# Patient Record
Sex: Male | Born: 2000
Health system: Southern US, Community
[De-identification: ages and names within clinical notes are randomized; demographics above are authoritative.]

## PROBLEM LIST (undated history)

## (undated) HISTORY — PX: ADENOIDECTOMY: SUR15

## (undated) HISTORY — PX: TONSILLECTOMY: SUR1361

---
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
## 2022-03-22 ENCOUNTER — Emergency Department (HOSPITAL_BASED_OUTPATIENT_CLINIC_OR_DEPARTMENT_OTHER): Payer: BC Managed Care – PPO

## 2022-03-22 ENCOUNTER — Emergency Department (HOSPITAL_BASED_OUTPATIENT_CLINIC_OR_DEPARTMENT_OTHER)
Admission: EM | Admit: 2022-03-22 | Discharge: 2022-03-22 | Disposition: A | Discharge status: Home / Self Care | Payer: BC Managed Care – PPO | Attending: Emergency Medicine | Admitting: Emergency Medicine

## 2022-03-22 ENCOUNTER — Other Ambulatory Visit: Payer: Self-pay

## 2022-03-22 DIAGNOSIS — S0990XA Unspecified injury of head, initial encounter: Secondary | ICD-10-CM | POA: Diagnosis not present

## 2022-03-22 DIAGNOSIS — R42 Dizziness and giddiness: Secondary | ICD-10-CM | POA: Diagnosis not present

## 2022-03-22 DIAGNOSIS — S0992XA Unspecified injury of nose, initial encounter: Secondary | ICD-10-CM

## 2022-03-22 DIAGNOSIS — W500XXA Accidental hit or strike by another person, initial encounter: Secondary | ICD-10-CM | POA: Diagnosis not present

## 2022-03-22 DIAGNOSIS — S060X0A Concussion without loss of consciousness, initial encounter: Secondary | ICD-10-CM | POA: Diagnosis not present

## 2022-03-22 NOTE — ED Provider Notes (Signed)
[facility] EMERGENCY DEPT Provider Note   CSN: [id number] Arrival date & time: 03/22/22  1849     History  Chief Complaint  Patient presents with   Facial Injury    Nose Injury   Dizziness    Jeremy Weber is a 21 y.o. male presenting to ED with a facial injury.  Patient reports that he was accidentally struck in the nose by an elbow playing a sport earlier today.  He had bleeding out of the nose which is since stopped.  He reports a headache and feels lightheaded.  He is not certain whether he has a prior history of concussions but thinks "maybe".  He is here with his father at bedside.  He is not on blood thinners and no other medical issues.  No other injuries reported.  HPI     Home Medications Prior to Admission medications   Medication Sig Start Date End Date Taking? Authorizing Provider  Amphetamine Sulfate (EVEKEO) 10 MG TABS Take 1 tablet by mouth daily.    [provider]  ibuprofen (ADVIL,MOTRIN) 200 MG tablet Take 400 mg by mouth once.    [provider]      Allergies    Sulfa antibiotics    Review of Systems   Review of Systems  Physical Exam Updated Vital Signs BP 123/72   Pulse 60   Temp 98.3 F (36.8 C) (Oral)   Resp 18   Ht 5\' 10"  (1.778 m)   Wt 74.8 kg   SpO2 100%   BMI 23.68 kg/m  Physical Exam Constitutional:      General: He is not in acute distress. HENT:     Head: Normocephalic.     Comments: Dried blood in bilateral nares, no active nasal bleeding, no septal hematoma.  Tenderness and ecchymoses of the bridge of the nose, no deformity of the nose Eyes:     Conjunctiva/sclera: Conjunctivae normal.     Pupils: Pupils are equal, round, and reactive to light.  Cardiovascular:     Rate and Rhythm: Normal rate and regular rhythm.  Pulmonary:     Effort: Pulmonary effort is normal. No respiratory distress.  Skin:    General: Skin is warm and dry.  Neurological:     General: No focal deficit present.      Mental Status: He is alert and oriented to person, place, and time. Mental status is at baseline.     Sensory: No sensory deficit.     Motor: No weakness.  Psychiatric:        Mood and Affect: Mood normal.        Behavior: Behavior normal.     ED Results / Procedures / Treatments   Labs (all labs ordered are listed, but only abnormal results are displayed) Labs Reviewed - No data to display  EKG None  Radiology CT Head Wo Contrast  Result Date: 03/22/2022 CLINICAL DATA:  Blunt trauma to the nose while playing sports with dizziness, initial encounter EXAM: CT HEAD WITHOUT CONTRAST TECHNIQUE: Contiguous axial images were obtained from the base of the skull through the vertex without intravenous contrast. RADIATION DOSE REDUCTION: This exam was performed according to the departmental dose-optimization program which includes automated exposure control, adjustment of the mA and/or kV according to patient size and/or use of iterative reconstruction technique. COMPARISON:  None Available. FINDINGS: Brain: No evidence of acute infarction, hemorrhage, hydrocephalus, extra-axial collection or mass lesion/mass effect. Vascular: No hyperdense vessel or unexpected calcification. Skull: Normal. Negative for fracture  or focal lesion. Sinuses/Orbits: Mild mucosal changes are noted within the left maxillary antrum and ethmoid sinuses. Other: The nasal bones are not included in this exam. IMPRESSION: No acute intracranial abnormality noted. Mild mucosal changes in the sinuses. Electronically Signed   By: Alcide Clever M.D.   On: 03/22/2022 20:36    Procedures Procedures    Medications Ordered in ED Medications - No data to display  ED Course/ Medical Decision Making/ A&P                           Medical Decision Making Amount and/or Complexity of Data Reviewed Radiology: ordered.   Patient is here with a traumatic injury to the nose and face, reporting a headache and lightheadedness.  CT scan of  the head ordered and personally viewed interpreted, showing no ICH.  Nasal bones poorly visualized - but I explained to pt and father there may be a nondisplaced nasal fx, this would be nonsurgical and should heal on its own.  No other traumatic injuries noted on exam.  Recommended concussion protocols at home.  Patient's father was present for the entirety of the history and exam.        Final Clinical Impression(s) / ED Diagnoses Final diagnoses:  Concussion without loss of consciousness, initial encounter  Injury of nose, initial encounter    Rx / DC Orders ED Discharge Orders     None         Willodean Leven, Kermit Balo, MD 03/23/22 [phone]

## 2022-03-22 NOTE — ED Triage Notes (Addendum)
Patient arrives with complaints of suspected broken and concussion after getting hit in the face (elbowed in his nose) while playing sports. Patient reports dizziness and feeling lightheaded.  Rates pain a 7/10.

## 2022-03-22 NOTE — Discharge Instructions (Addendum)
You should avoid any contact sports or dangerous activities for a minimum of 1 month, until you feel completely back to baseline have recovered from your concussion.  Please follow-up with your primary care provider.  If you continue having significant symptoms of concussion in 2 weeks, including lightheadedness, dizziness, sensitivity to light, or balance difficulty, please call to schedule follow-up appointment at the sports medicine concussion clinic at the number above

## 2022-03-22 NOTE — ED Notes (Signed)
Pt verbalizes understanding of discharge instructions. Opportunity for questioning and answers were provided. Pt discharged from ED to home.   ? ?

## 2022-03-28 DIAGNOSIS — H1032 Unspecified acute conjunctivitis, left eye: Secondary | ICD-10-CM | POA: Diagnosis not present

## 2022-05-25 DIAGNOSIS — J029 Acute pharyngitis, unspecified: Secondary | ICD-10-CM | POA: Diagnosis not present

## 2022-05-30 DIAGNOSIS — U071 COVID-19: Secondary | ICD-10-CM | POA: Diagnosis not present

## 2022-05-30 DIAGNOSIS — J039 Acute tonsillitis, unspecified: Secondary | ICD-10-CM | POA: Diagnosis not present

## 2022-05-30 DIAGNOSIS — R059 Cough, unspecified: Secondary | ICD-10-CM | POA: Diagnosis not present

## 2022-10-20 DIAGNOSIS — J069 Acute upper respiratory infection, unspecified: Secondary | ICD-10-CM | POA: Diagnosis not present

## 2022-11-30 DIAGNOSIS — J069 Acute upper respiratory infection, unspecified: Secondary | ICD-10-CM | POA: Diagnosis not present

## 2022-12-02 DIAGNOSIS — J069 Acute upper respiratory infection, unspecified: Secondary | ICD-10-CM | POA: Diagnosis not present

## 2022-12-02 DIAGNOSIS — R5383 Other fatigue: Secondary | ICD-10-CM | POA: Diagnosis not present

## 2022-12-02 DIAGNOSIS — R059 Cough, unspecified: Secondary | ICD-10-CM | POA: Diagnosis not present

## 2022-12-02 DIAGNOSIS — R509 Fever, unspecified: Secondary | ICD-10-CM | POA: Diagnosis not present

## 2022-12-02 DIAGNOSIS — B9789 Other viral agents as the cause of diseases classified elsewhere: Secondary | ICD-10-CM | POA: Diagnosis not present

## 2022-12-02 DIAGNOSIS — Z720 Tobacco use: Secondary | ICD-10-CM | POA: Diagnosis not present

## 2022-12-02 DIAGNOSIS — J029 Acute pharyngitis, unspecified: Secondary | ICD-10-CM | POA: Diagnosis not present

## 2023-01-11 DIAGNOSIS — J040 Acute laryngitis: Secondary | ICD-10-CM | POA: Diagnosis not present

## 2023-01-11 DIAGNOSIS — R053 Chronic cough: Secondary | ICD-10-CM | POA: Diagnosis not present

## 2023-02-17 DIAGNOSIS — J069 Acute upper respiratory infection, unspecified: Secondary | ICD-10-CM | POA: Diagnosis not present

## 2023-02-17 DIAGNOSIS — Z0000 Encounter for general adult medical examination without abnormal findings: Secondary | ICD-10-CM | POA: Diagnosis not present

## 2023-02-17 DIAGNOSIS — F1010 Alcohol abuse, uncomplicated: Secondary | ICD-10-CM | POA: Diagnosis not present

## 2023-02-17 DIAGNOSIS — J302 Other seasonal allergic rhinitis: Secondary | ICD-10-CM | POA: Diagnosis not present

## 2023-02-17 DIAGNOSIS — F17210 Nicotine dependence, cigarettes, uncomplicated: Secondary | ICD-10-CM | POA: Diagnosis not present
